# Patient Record
Sex: Female | Born: 1950 | Race: White | Hispanic: No | Marital: Married | Smoking: Never smoker
Health system: Southern US, Community
[De-identification: ages and names within clinical notes are randomized; demographics above are authoritative.]

## PROBLEM LIST (undated history)

## (undated) DIAGNOSIS — Z923 Personal history of irradiation: Secondary | ICD-10-CM

---

## 1994-01-12 DIAGNOSIS — C50919 Malignant neoplasm of unspecified site of unspecified female breast: Secondary | ICD-10-CM

## 1994-01-12 HISTORY — DX: Malignant neoplasm of unspecified site of unspecified female breast: C50.919

## 1994-01-12 HISTORY — PX: BREAST LUMPECTOMY: SHX2

## 1997-08-13 ENCOUNTER — Other Ambulatory Visit: Admission: RE | Admit: 1997-08-13 | Discharge: 1997-08-13 | Payer: Self-pay | Admitting: *Deleted

## 1997-09-21 ENCOUNTER — Ambulatory Visit (HOSPITAL_COMMUNITY): Admission: RE | Admit: 1997-09-21 | Discharge: 1997-09-21 | Payer: Self-pay | Admitting: Family Medicine

## 1998-09-30 ENCOUNTER — Other Ambulatory Visit: Admission: RE | Admit: 1998-09-30 | Discharge: 1998-09-30 | Payer: Self-pay | Admitting: *Deleted

## 1999-09-22 ENCOUNTER — Other Ambulatory Visit: Admission: RE | Admit: 1999-09-22 | Discharge: 1999-09-22 | Payer: Self-pay | Admitting: *Deleted

## 1999-10-20 ENCOUNTER — Encounter: Admission: RE | Admit: 1999-10-20 | Discharge: 1999-10-20 | Payer: Self-pay | Admitting: *Deleted

## 1999-10-20 ENCOUNTER — Encounter: Payer: Self-pay | Admitting: *Deleted

## 2000-10-11 ENCOUNTER — Other Ambulatory Visit: Admission: RE | Admit: 2000-10-11 | Discharge: 2000-10-11 | Payer: Self-pay | Admitting: *Deleted

## 2000-11-04 ENCOUNTER — Encounter: Admission: RE | Admit: 2000-11-04 | Discharge: 2000-11-04 | Payer: Self-pay | Admitting: *Deleted

## 2000-11-04 ENCOUNTER — Encounter: Payer: Self-pay | Admitting: *Deleted

## 2001-11-07 ENCOUNTER — Encounter: Payer: Self-pay | Admitting: *Deleted

## 2001-11-07 ENCOUNTER — Encounter: Admission: RE | Admit: 2001-11-07 | Discharge: 2001-11-07 | Payer: Self-pay | Admitting: *Deleted

## 2001-11-11 ENCOUNTER — Other Ambulatory Visit: Admission: RE | Admit: 2001-11-11 | Discharge: 2001-11-11 | Payer: Self-pay | Admitting: *Deleted

## 2002-04-14 ENCOUNTER — Other Ambulatory Visit: Admission: RE | Admit: 2002-04-14 | Discharge: 2002-04-14 | Payer: Self-pay | Admitting: *Deleted

## 2002-11-10 ENCOUNTER — Encounter: Admission: RE | Admit: 2002-11-10 | Discharge: 2002-11-10 | Payer: Self-pay | Admitting: *Deleted

## 2003-04-13 ENCOUNTER — Other Ambulatory Visit: Admission: RE | Admit: 2003-04-13 | Discharge: 2003-04-13 | Payer: Self-pay | Admitting: *Deleted

## 2004-01-30 ENCOUNTER — Encounter: Admission: RE | Admit: 2004-01-30 | Discharge: 2004-01-30 | Payer: Self-pay | Admitting: Obstetrics and Gynecology

## 2004-06-04 ENCOUNTER — Other Ambulatory Visit: Admission: RE | Admit: 2004-06-04 | Discharge: 2004-06-04 | Payer: Self-pay | Admitting: Obstetrics and Gynecology

## 2008-05-02 ENCOUNTER — Encounter: Admission: RE | Admit: 2008-05-02 | Discharge: 2008-05-02 | Payer: Self-pay | Admitting: Family Medicine

## 2008-05-31 ENCOUNTER — Other Ambulatory Visit: Admission: RE | Admit: 2008-05-31 | Discharge: 2008-05-31 | Payer: Self-pay | Admitting: Family Medicine

## 2009-05-07 ENCOUNTER — Encounter: Admission: RE | Admit: 2009-05-07 | Discharge: 2009-05-07 | Payer: Self-pay | Admitting: Family Medicine

## 2010-04-22 ENCOUNTER — Other Ambulatory Visit: Payer: Self-pay | Admitting: Obstetrics and Gynecology

## 2010-04-22 DIAGNOSIS — Z1231 Encounter for screening mammogram for malignant neoplasm of breast: Secondary | ICD-10-CM

## 2010-05-12 ENCOUNTER — Ambulatory Visit
Admission: RE | Admit: 2010-05-12 | Discharge: 2010-05-12 | Disposition: A | Discharge status: Home / Self Care | Payer: BC Managed Care – PPO | Source: Ambulatory Visit | Attending: Obstetrics and Gynecology | Admitting: Obstetrics and Gynecology

## 2010-05-12 DIAGNOSIS — Z1231 Encounter for screening mammogram for malignant neoplasm of breast: Secondary | ICD-10-CM

## 2010-05-14 ENCOUNTER — Other Ambulatory Visit: Payer: Self-pay | Admitting: Obstetrics and Gynecology

## 2010-05-14 DIAGNOSIS — R928 Other abnormal and inconclusive findings on diagnostic imaging of breast: Secondary | ICD-10-CM

## 2010-05-19 ENCOUNTER — Ambulatory Visit
Admission: RE | Admit: 2010-05-19 | Discharge: 2010-05-19 | Disposition: A | Discharge status: Home / Self Care | Payer: BC Managed Care – PPO | Source: Ambulatory Visit | Attending: Obstetrics and Gynecology | Admitting: Obstetrics and Gynecology

## 2010-05-19 DIAGNOSIS — R928 Other abnormal and inconclusive findings on diagnostic imaging of breast: Secondary | ICD-10-CM

## 2011-05-12 ENCOUNTER — Other Ambulatory Visit: Payer: Self-pay | Admitting: Obstetrics and Gynecology

## 2011-05-12 DIAGNOSIS — Z1231 Encounter for screening mammogram for malignant neoplasm of breast: Secondary | ICD-10-CM

## 2011-05-22 ENCOUNTER — Ambulatory Visit
Admission: RE | Admit: 2011-05-22 | Discharge: 2011-05-22 | Disposition: A | Discharge status: Home / Self Care | Payer: BC Managed Care – PPO | Source: Ambulatory Visit | Attending: Obstetrics and Gynecology | Admitting: Obstetrics and Gynecology

## 2011-05-22 DIAGNOSIS — Z1231 Encounter for screening mammogram for malignant neoplasm of breast: Secondary | ICD-10-CM

## 2012-07-01 ENCOUNTER — Other Ambulatory Visit: Payer: Self-pay

## 2012-07-01 DIAGNOSIS — Z1231 Encounter for screening mammogram for malignant neoplasm of breast: Secondary | ICD-10-CM

## 2012-07-05 ENCOUNTER — Ambulatory Visit
Admission: RE | Admit: 2012-07-05 | Discharge: 2012-07-05 | Disposition: A | Discharge status: Home / Self Care | Payer: BC Managed Care – PPO | Source: Ambulatory Visit

## 2012-07-05 DIAGNOSIS — Z1231 Encounter for screening mammogram for malignant neoplasm of breast: Secondary | ICD-10-CM

## 2013-08-03 ENCOUNTER — Other Ambulatory Visit: Payer: Self-pay

## 2013-08-03 DIAGNOSIS — Z1231 Encounter for screening mammogram for malignant neoplasm of breast: Secondary | ICD-10-CM

## 2013-08-07 ENCOUNTER — Ambulatory Visit
Admission: RE | Admit: 2013-08-07 | Discharge: 2013-08-07 | Disposition: A | Discharge status: Home / Self Care | Payer: BC Managed Care – PPO | Source: Ambulatory Visit

## 2013-08-07 DIAGNOSIS — Z1231 Encounter for screening mammogram for malignant neoplasm of breast: Secondary | ICD-10-CM

## 2014-07-02 ENCOUNTER — Other Ambulatory Visit: Payer: Self-pay

## 2014-07-02 DIAGNOSIS — Z1231 Encounter for screening mammogram for malignant neoplasm of breast: Secondary | ICD-10-CM

## 2014-07-02 DIAGNOSIS — Z853 Personal history of malignant neoplasm of breast: Secondary | ICD-10-CM

## 2014-07-02 DIAGNOSIS — Z9889 Other specified postprocedural states: Secondary | ICD-10-CM

## 2014-08-20 ENCOUNTER — Ambulatory Visit
Admission: RE | Admit: 2014-08-20 | Discharge: 2014-08-20 | Disposition: A | Discharge status: Home / Self Care | Payer: BC Managed Care – PPO | Source: Ambulatory Visit

## 2014-08-20 DIAGNOSIS — Z853 Personal history of malignant neoplasm of breast: Secondary | ICD-10-CM

## 2014-08-20 DIAGNOSIS — Z1231 Encounter for screening mammogram for malignant neoplasm of breast: Secondary | ICD-10-CM

## 2014-08-20 DIAGNOSIS — Z9889 Other specified postprocedural states: Secondary | ICD-10-CM

## 2015-07-30 ENCOUNTER — Other Ambulatory Visit: Payer: Self-pay | Admitting: Internal Medicine

## 2015-07-30 ENCOUNTER — Other Ambulatory Visit: Payer: Self-pay | Admitting: Obstetrics and Gynecology

## 2015-07-30 DIAGNOSIS — Z1231 Encounter for screening mammogram for malignant neoplasm of breast: Secondary | ICD-10-CM

## 2015-09-02 ENCOUNTER — Ambulatory Visit: Payer: BC Managed Care – PPO

## 2015-09-05 ENCOUNTER — Ambulatory Visit
Admission: RE | Admit: 2015-09-05 | Discharge: 2015-09-05 | Disposition: A | Discharge status: Home / Self Care | Payer: BC Managed Care – PPO | Source: Ambulatory Visit | Attending: Internal Medicine | Admitting: Internal Medicine

## 2015-09-05 DIAGNOSIS — Z1231 Encounter for screening mammogram for malignant neoplasm of breast: Secondary | ICD-10-CM

## 2016-07-29 ENCOUNTER — Other Ambulatory Visit: Payer: Self-pay | Admitting: Internal Medicine

## 2016-07-29 ENCOUNTER — Other Ambulatory Visit: Payer: Self-pay | Admitting: Family Medicine

## 2016-07-29 DIAGNOSIS — Z1231 Encounter for screening mammogram for malignant neoplasm of breast: Secondary | ICD-10-CM

## 2016-09-10 ENCOUNTER — Ambulatory Visit
Admission: RE | Admit: 2016-09-10 | Discharge: 2016-09-10 | Disposition: A | Discharge status: Home / Self Care | Payer: BC Managed Care – PPO | Source: Ambulatory Visit | Attending: Family Medicine | Admitting: Family Medicine

## 2016-09-10 DIAGNOSIS — Z1231 Encounter for screening mammogram for malignant neoplasm of breast: Secondary | ICD-10-CM

## 2017-08-11 ENCOUNTER — Other Ambulatory Visit: Payer: Self-pay | Admitting: Family Medicine

## 2017-08-11 DIAGNOSIS — Z1231 Encounter for screening mammogram for malignant neoplasm of breast: Secondary | ICD-10-CM

## 2017-09-29 ENCOUNTER — Ambulatory Visit: Payer: BC Managed Care – PPO

## 2017-10-06 ENCOUNTER — Ambulatory Visit
Admission: RE | Admit: 2017-10-06 | Discharge: 2017-10-06 | Disposition: A | Discharge status: Home / Self Care | Payer: Medicare Other | Source: Ambulatory Visit | Attending: Family Medicine | Admitting: Family Medicine

## 2017-10-06 DIAGNOSIS — Z1231 Encounter for screening mammogram for malignant neoplasm of breast: Secondary | ICD-10-CM

## 2017-10-06 HISTORY — DX: Personal history of irradiation: Z92.3

## 2017-10-07 ENCOUNTER — Other Ambulatory Visit: Payer: Self-pay | Admitting: Family Medicine

## 2017-10-07 DIAGNOSIS — R928 Other abnormal and inconclusive findings on diagnostic imaging of breast: Secondary | ICD-10-CM

## 2017-10-12 ENCOUNTER — Other Ambulatory Visit: Payer: Self-pay | Admitting: Internal Medicine

## 2017-10-12 DIAGNOSIS — R928 Other abnormal and inconclusive findings on diagnostic imaging of breast: Secondary | ICD-10-CM

## 2017-10-13 ENCOUNTER — Ambulatory Visit
Admission: RE | Admit: 2017-10-13 | Discharge: 2017-10-13 | Disposition: A | Discharge status: Home / Self Care | Payer: Medicare Other | Source: Ambulatory Visit | Attending: Family Medicine | Admitting: Family Medicine

## 2017-10-13 DIAGNOSIS — R928 Other abnormal and inconclusive findings on diagnostic imaging of breast: Secondary | ICD-10-CM

## 2018-09-07 ENCOUNTER — Other Ambulatory Visit: Payer: Self-pay | Admitting: Family Medicine

## 2018-09-07 DIAGNOSIS — Z1231 Encounter for screening mammogram for malignant neoplasm of breast: Secondary | ICD-10-CM

## 2018-12-20 ENCOUNTER — Ambulatory Visit
Admission: RE | Admit: 2018-12-20 | Discharge: 2018-12-20 | Disposition: A | Discharge status: Home / Self Care | Payer: Medicare Other | Source: Ambulatory Visit | Attending: Family Medicine | Admitting: Family Medicine

## 2018-12-20 ENCOUNTER — Other Ambulatory Visit: Payer: Self-pay

## 2018-12-20 DIAGNOSIS — Z1231 Encounter for screening mammogram for malignant neoplasm of breast: Secondary | ICD-10-CM

## 2018-12-22 ENCOUNTER — Other Ambulatory Visit: Payer: Self-pay

## 2018-12-22 ENCOUNTER — Encounter (INDEPENDENT_AMBULATORY_CARE_PROVIDER_SITE_OTHER): Payer: Self-pay | Admitting: Otolaryngology

## 2018-12-22 ENCOUNTER — Ambulatory Visit (INDEPENDENT_AMBULATORY_CARE_PROVIDER_SITE_OTHER): Payer: Medicare Other | Admitting: Otolaryngology

## 2018-12-22 VITALS — Temp 98.1°F

## 2018-12-22 DIAGNOSIS — H6123 Impacted cerumen, bilateral: Secondary | ICD-10-CM

## 2018-12-22 NOTE — Progress Notes (Signed)
HPI: Krista Horn is a 68 y.o. female who presents for evaluation of blockage of her hearing especially on the left side.  Is doing little bit better today..  Past Medical History:  Diagnosis Date  . Personal history of radiation therapy    Past Surgical History:  Procedure Laterality Date  . BREAST LUMPECTOMY Left 1996   Social History   Socioeconomic History  . Marital status: Married    Spouse name: Not on file  . Number of children: Not on file  . Years of education: Not on file  . Highest education level: Not on file  Occupational History  . Not on file  Tobacco Use  . Smoking status: Never Smoker  . Smokeless tobacco: Never Used  Substance and Sexual Activity  . Alcohol use: Not on file  . Drug use: Not on file  . Sexual activity: Not on file  Other Topics Concern  . Not on file  Social History Narrative  . Not on file   Social Determinants of Health   Financial Resource Strain:   . Difficulty of Paying Living Expenses: Not on file  Food Insecurity:   . Worried About Charity fundraiser in the Last Year: Not on file  . Ran Out of Food in the Last Year: Not on file  Transportation Needs:   . Lack of Transportation (Medical): Not on file  . Lack of Transportation (Non-Medical): Not on file  Physical Activity:   . Days of Exercise per Week: Not on file  . Minutes of Exercise per Session: Not on file  Stress:   . Feeling of Stress : Not on file  Social Connections:   . Frequency of Communication with Friends and Family: Not on file  . Frequency of Social Gatherings with Friends and Family: Not on file  . Attends Religious Services: Not on file  . Active Member of Clubs or Organizations: Not on file  . Attends Archivist Meetings: Not on file  . Marital Status: Not on file   No family history on file. No Known Allergies Prior to Admission medications   Not on File     Positive ROS: Otherwise negative  All other systems have been  reviewed and were otherwise negative with the exception of those mentioned in the HPI and as above.  Physical Exam: Constitutional: Alert, well-appearing, no acute distress Ears: External ears without lesions or tenderness. Ear canals moderate cerumen buildup. Nasal: External nose without lesions. Clear nasal passages Oral: Oropharynx clear. Neck: No palpable adenopathy or masses Respiratory: Breathing comfortably  Skin: No facial/neck lesions or rash noted.  Cerumen impaction removal  Date/Time: 12/22/2018 5:33 PM Performed by: Rozetta Nunnery, MD Authorized by: Rozetta Nunnery, MD   Consent:    Consent obtained:  Verbal   Consent given by:  Patient   Risks discussed:  Pain and bleeding Procedure details:    Location:  L ear and R ear   Procedure type: suction and forceps   Post-procedure details:    Inspection:  TM intact and canal normal   Hearing quality:  Improved   Patient tolerance of procedure:  Tolerated well, no immediate complications    Assessment: Cerumen buildup  Plan: This was cleaned in the office. She will follow-up as needed  Radene Journey, MD

## 2019-06-30 ENCOUNTER — Other Ambulatory Visit: Payer: Self-pay

## 2019-06-30 ENCOUNTER — Encounter (INDEPENDENT_AMBULATORY_CARE_PROVIDER_SITE_OTHER): Payer: Self-pay | Admitting: Otolaryngology

## 2019-06-30 ENCOUNTER — Ambulatory Visit (INDEPENDENT_AMBULATORY_CARE_PROVIDER_SITE_OTHER): Payer: Medicare PPO | Admitting: Otolaryngology

## 2019-06-30 VITALS — Temp 97.7°F

## 2019-06-30 DIAGNOSIS — H6123 Impacted cerumen, bilateral: Secondary | ICD-10-CM

## 2019-06-30 DIAGNOSIS — H60312 Diffuse otitis externa, left ear: Secondary | ICD-10-CM | POA: Diagnosis not present

## 2019-06-30 NOTE — Progress Notes (Signed)
HPI: Krista Horn is a 69 y.o. female who returns today for evaluation of pain and drainage from the left ear over the past couple weeks.  She had previously been seen in December of last year with cerumen impaction and left external otitis..  Past Medical History:  Diagnosis Date  . Personal history of radiation therapy    Past Surgical History:  Procedure Laterality Date  . BREAST LUMPECTOMY Left 1996   Social History   Socioeconomic History  . Marital status: Married    Spouse name: Not on file  . Number of children: Not on file  . Years of education: Not on file  . Highest education level: Not on file  Occupational History  . Not on file  Tobacco Use  . Smoking status: Never Smoker  . Smokeless tobacco: Never Used  Substance and Sexual Activity  . Alcohol use: Not on file  . Drug use: Not on file  . Sexual activity: Not on file  Other Topics Concern  . Not on file  Social History Narrative  . Not on file   Social Determinants of Health   Financial Resource Strain:   . Difficulty of Paying Living Expenses:   Food Insecurity:   . Worried About Charity fundraiser in the Last Year:   . Arboriculturist in the Last Year:   Transportation Needs:   . Film/video editor (Medical):   Marland Kitchen Lack of Transportation (Non-Medical):   Physical Activity:   . Days of Exercise per Week:   . Minutes of Exercise per Session:   Stress:   . Feeling of Stress :   Social Connections:   . Frequency of Communication with Friends and Family:   . Frequency of Social Gatherings with Friends and Family:   . Attends Religious Services:   . Active Member of Clubs or Organizations:   . Attends Archivist Meetings:   Marland Kitchen Marital Status:    No family history on file. No Known Allergies Prior to Admission medications   Not on File     Positive ROS: Otherwise negative  All other systems have been reviewed and were otherwise negative with the exception of those  mentioned in the HPI and as above.  Physical Exam: Constitutional: Alert, well-appearing, no acute distress Ears: External ears without lesions or tenderness.  Right ear canal had minimal cerumen buildup that was cleaned with a curette.  Right ear canal and TM are otherwise clear.  Left ear canal reveals a large amount of debris as well as fungal elements with left fungal external otitis with some granulation tissue posteriorly superiorly.  After cleaning the ear canal with suction and hydrogen peroxide.  I applied gentian violet Ciprodex and CSF HC powder to the left ear only. Nasal: External nose without lesions.. Clear nasal passages Oral: Lips and gums without lesions. Tongue and palate mucosa without lesions. Posterior oropharynx clear. Neck: No palpable adenopathy or masses Respiratory: Breathing comfortably  Skin: No facial/neck lesions or rash noted.  Cerumen impaction removal  Date/Time: 06/30/2019 5:03 PM Performed by: Rozetta Nunnery, MD Authorized by: Rozetta Nunnery, MD   Consent:    Consent obtained:  Verbal   Consent given by:  Patient   Risks discussed:  Pain and bleeding Procedure details:    Location:  L ear and R ear   Procedure type: curette and suction   Post-procedure details:    Inspection:  TM intact and canal normal   Hearing  quality:  Improved   Patient tolerance of procedure:  Tolerated well, no immediate complications Comments:     Right ear canal had minimal wax buildup that was removed with a curette.  Left ear canal was occluded with debris as well as fungal external otitis.  After cleaning the ear canal I applied gentian violet, Ciprodex and CSF powder to the left ear.    Assessment: Cerumen buildup bilaterally. Acute left external otitis with fungal elements.  Plan: Recommended keeping the left ear dry for the next 2 days. I prescribed Cortisporin otic suspension drops to use if she develops any increasing pain or discomfort. Also  reviewed with her concerning irrigating or washing the ears with alcohol and vinegar in the future if she has any itching or discomfort in the ears. She will follow-up as needed.   Radene Journey, MD

## 2019-10-06 ENCOUNTER — Other Ambulatory Visit (INDEPENDENT_AMBULATORY_CARE_PROVIDER_SITE_OTHER): Payer: Self-pay

## 2019-10-06 MED ORDER — NEOMYCIN-POLYMYXIN-HC 3.5-10000-1 OT SOLN: 4.0000 [drp] | Freq: Four times a day (QID) | OTIC | 0 refills | Status: AC

## 2019-10-09 ENCOUNTER — Other Ambulatory Visit: Payer: Self-pay

## 2019-10-09 ENCOUNTER — Ambulatory Visit (INDEPENDENT_AMBULATORY_CARE_PROVIDER_SITE_OTHER): Payer: Medicare PPO | Admitting: Otolaryngology

## 2019-10-09 VITALS — Temp 97.3°F

## 2019-10-09 DIAGNOSIS — H60312 Diffuse otitis externa, left ear: Secondary | ICD-10-CM | POA: Diagnosis not present

## 2019-10-09 DIAGNOSIS — H6123 Impacted cerumen, bilateral: Secondary | ICD-10-CM | POA: Diagnosis not present

## 2019-10-09 NOTE — Progress Notes (Signed)
HPI: Krista Horn is a 69 y.o. female who presents for evaluation of left ear complaints.  She was seen 3 months ago with chronic left external otitis.  She has done well up until just recently with blockage of her left ear.  She was prescribed antibiotic eardrops which she did not get on her previous visit and started these this past weekend but still has blockage of the left ear.  She has underlying hearing loss in both ears..  Past Medical History:  Diagnosis Date  . Personal history of radiation therapy    Past Surgical History:  Procedure Laterality Date  . BREAST LUMPECTOMY Left 1996   Social History   Socioeconomic History  . Marital status: Married    Spouse name: Not on file  . Number of children: Not on file  . Years of education: Not on file  . Highest education level: Not on file  Occupational History  . Not on file  Tobacco Use  . Smoking status: Never Smoker  . Smokeless tobacco: Never Used  Substance and Sexual Activity  . Alcohol use: Not on file  . Drug use: Not on file  . Sexual activity: Not on file  Other Topics Concern  . Not on file  Social History Narrative  . Not on file   Social Determinants of Health   Financial Resource Strain:   . Difficulty of Paying Living Expenses: Not on file  Food Insecurity:   . Worried About Charity fundraiser in the Last Year: Not on file  . Ran Out of Food in the Last Year: Not on file  Transportation Needs:   . Lack of Transportation (Medical): Not on file  . Lack of Transportation (Non-Medical): Not on file  Physical Activity:   . Days of Exercise per Week: Not on file  . Minutes of Exercise per Session: Not on file  Stress:   . Feeling of Stress : Not on file  Social Connections:   . Frequency of Communication with Friends and Family: Not on file  . Frequency of Social Gatherings with Friends and Family: Not on file  . Attends Religious Services: Not on file  . Active Member of Clubs or  Organizations: Not on file  . Attends Archivist Meetings: Not on file  . Marital Status: Not on file   No family history on file. No Known Allergies Prior to Admission medications   Medication Sig Start Date End Date Taking? Authorizing Provider  neomycin-polymyxin-hydrocortisone (CORTISPORIN) OTIC solution Place 4 drops into the left ear 4 (four) times daily. Use 4-5-drops in ear for pain for 1 week. 10/06/19   Rozetta Nunnery, MD     Positive ROS: Otherwise negative  All other systems have been reviewed and were otherwise negative with the exception of those mentioned in the HPI and as above.  Physical Exam: Constitutional: Alert, well-appearing, no acute distress Ears: External ears without lesions or tenderness. Ear canals with minimal wax buildup on the right side and normal right TM.  Left ear canal reveals chronic external otitis with very thickened left TM and small amount of granulation tissue in the left ear canal.  This was cleaned with suction and irrigation with hydroperoxide and alcohol.  After cleaning the left.  ear canal I applied gentian violet, Ciprodex and CSF powder..  On tuning fork testing after cleaning the ears AC > BC bilaterally however Weber lateralized to the left side.  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 Nasal: External nose without lesions. Clear nasal passages Oral: Oropharynx clear. Neck: No palpable adenopathy or masses Respiratory: Breathing comfortably  Skin: No facial/neck lesions or rash noted.  Cerumen impaction removal  Date/Time: 10/09/2019 11:24 AM Performed by: Rozetta Nunnery, MD Authorized by: Rozetta Nunnery, MD   Consent:    Consent obtained:  Verbal   Consent given by:  Patient   Risks discussed:  Pain and bleeding Procedure details:    Location:  L ear and R ear Post-procedure details:    Inspection:  TM intact and canal normal   Hearing  quality:  Improved   Patient tolerance of procedure:  Tolerated well, no immediate complications Comments:     Right ear canal with minimal wax that was cleaned with curette and suction.  Left ear canal was occluded with drainage as well as inflammatory changes in the small amount of granulation tissue.  After cleaning the left ear canal I applied gentian violet, Ciprodex and CSF powder to the left ear.    Assessment: Minimal wax on the right side with clear right TM. Left ear canal with chronic left external otitis with granulation tissue and very thickened left TM. 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 Recommended keeping the ear dry and not using the eardrops for the next 2 days.  She will follow up.  10 days for recheck.  Radene Journey, MD

## 2019-10-19 ENCOUNTER — Ambulatory Visit (INDEPENDENT_AMBULATORY_CARE_PROVIDER_SITE_OTHER): Payer: Medicare PPO | Admitting: Otolaryngology

## 2019-10-19 ENCOUNTER — Encounter (INDEPENDENT_AMBULATORY_CARE_PROVIDER_SITE_OTHER): Payer: Self-pay | Admitting: Otolaryngology

## 2019-10-19 ENCOUNTER — Other Ambulatory Visit: Payer: Self-pay

## 2019-10-19 VITALS — Temp 97.3°F

## 2019-10-19 DIAGNOSIS — H903 Sensorineural hearing loss, bilateral: Secondary | ICD-10-CM | POA: Diagnosis not present

## 2019-10-19 NOTE — Progress Notes (Signed)
HPI: Krista Horn is a 69 y.o. female who returns today for evaluation of hearing.  She brings with her a hearing test that demonstrates isolated high-frequency sensorineural hearing loss above 2000 frequency.  Both ears were relatively symmetric with hearing dropping down to 40-50 dB above the 3000 frequency.  In the 500 - 2000 range.  Her hearing was approximately 5-10 dB. Her left ear is doing much better  Past Medical History:  Diagnosis Date  . Personal history of radiation therapy    Past Surgical History:  Procedure Laterality Date  . BREAST LUMPECTOMY Left 1996   Social History   Socioeconomic History  . Marital status: Married    Spouse name: Not on file  . Number of children: Not on file  . Years of education: Not on file  . Highest education level: Not on file  Occupational History  . Not on file  Tobacco Use  . Smoking status: Never Smoker  . Smokeless tobacco: Never Used  Substance and Sexual Activity  . Alcohol use: Not on file  . Drug use: Not on file  . Sexual activity: Not on file  Other Topics Concern  . Not on file  Social History Narrative  . Not on file   Social Determinants of Health   Financial Resource Strain:   . Difficulty of Paying Living Expenses: Not on file  Food Insecurity:   . Worried About Charity fundraiser in the Last Year: Not on file  . Ran Out of Food in the Last Year: Not on file  Transportation Needs:   . Lack of Transportation (Medical): Not on file  . Lack of Transportation (Non-Medical): Not on file  Physical Activity:   . Days of Exercise per Week: Not on file  . Minutes of Exercise per Session: Not on file  Stress:   . Feeling of Stress : Not on file  Social Connections:   . Frequency of Communication with Friends and Family: Not on file  . Frequency of Social Gatherings with Friends and Family: Not on file  . Attends Religious Services: Not on file  . Active Member of Clubs or Organizations: Not on file  .  Attends Archivist Meetings: Not on file  . Marital Status: Not on file   No family history on file. No Known Allergies Prior to Admission medications   Medication Sig Start Date End Date Taking? Authorizing Provider  neomycin-polymyxin-hydrocortisone (CORTISPORIN) OTIC solution Place 4 drops into the left ear 4 (four) times daily. Use 4-5-drops in ear for pain for 1 week. 10/06/19   Rozetta Nunnery, MD     Positive ROS: Otherwise negative  All other systems have been reviewed and were otherwise negative with the exception of those mentioned in the HPI and as above.  Physical Exam: Constitutional: Alert, well-appearing, no acute distress Ears: External ears without lesions or tenderness.  Ear canals are clear bilaterally with gentian violet in the left ear canal.  Ear canals are dry TMs are clear with good mobility pneumatic otoscopy. Nasal: External nose without lesions.. Clear nasal passages Oral: Lips and gums without lesions. Tongue and palate mucosa without lesions. Posterior oropharynx clear. Neck: No palpable adenopathy or masses Respiratory: Breathing comfortably  Skin: No facial/neck lesions or rash noted.  Procedures  Assessment: History of recurrent left external otitis. High-frequency sensorineural hearing loss in both ears above 3000 frequency.  Plan: She inquires about whether to get hearing aids or not.  I discussed with her  that her hearing in the mid and lower frequencies is good but she does have a high-frequency sensorineural hearing loss and notices problems with her hearing when there is background noise as would be expected.  Discussed that hearing aids would be beneficial if she is having trouble socially with her hearing. Concerning the external otitis recommend keeping the ear dry in the ear should do well.  She will follow-up if she has any recurrent ear problems.   Radene Journey, MD

## 2019-10-23 ENCOUNTER — Encounter (INDEPENDENT_AMBULATORY_CARE_PROVIDER_SITE_OTHER): Payer: Self-pay

## 2019-11-09 ENCOUNTER — Other Ambulatory Visit: Payer: Self-pay | Admitting: Internal Medicine

## 2019-11-09 DIAGNOSIS — Z1231 Encounter for screening mammogram for malignant neoplasm of breast: Secondary | ICD-10-CM

## 2019-12-21 ENCOUNTER — Ambulatory Visit: Payer: Medicare PPO

## 2020-01-30 ENCOUNTER — Ambulatory Visit: Payer: Medicare PPO

## 2020-03-12 ENCOUNTER — Ambulatory Visit
Admission: RE | Admit: 2020-03-12 | Discharge: 2020-03-12 | Disposition: A | Discharge status: Home / Self Care | Payer: Medicare PPO | Source: Ambulatory Visit | Attending: Internal Medicine | Admitting: Internal Medicine

## 2020-03-12 ENCOUNTER — Ambulatory Visit: Payer: Medicare PPO

## 2020-03-12 ENCOUNTER — Other Ambulatory Visit: Payer: Self-pay

## 2020-03-12 DIAGNOSIS — Z1231 Encounter for screening mammogram for malignant neoplasm of breast: Secondary | ICD-10-CM

## 2020-11-21 DIAGNOSIS — L570 Actinic keratosis: Secondary | ICD-10-CM | POA: Diagnosis not present

## 2020-11-21 DIAGNOSIS — L814 Other melanin hyperpigmentation: Secondary | ICD-10-CM | POA: Diagnosis not present

## 2020-11-21 DIAGNOSIS — L300 Nummular dermatitis: Secondary | ICD-10-CM | POA: Diagnosis not present

## 2020-11-21 DIAGNOSIS — L821 Other seborrheic keratosis: Secondary | ICD-10-CM | POA: Diagnosis not present

## 2021-01-29 ENCOUNTER — Other Ambulatory Visit: Payer: Self-pay | Admitting: Internal Medicine

## 2021-01-29 DIAGNOSIS — Z1231 Encounter for screening mammogram for malignant neoplasm of breast: Secondary | ICD-10-CM

## 2021-03-13 ENCOUNTER — Ambulatory Visit: Payer: Medicare PPO

## 2021-03-13 ENCOUNTER — Ambulatory Visit
Admission: RE | Admit: 2021-03-13 | Discharge: 2021-03-13 | Disposition: A | Discharge status: Home / Self Care | Payer: Medicare PPO | Source: Ambulatory Visit | Attending: Internal Medicine | Admitting: Internal Medicine

## 2021-03-13 DIAGNOSIS — Z1231 Encounter for screening mammogram for malignant neoplasm of breast: Secondary | ICD-10-CM | POA: Diagnosis not present

## 2021-04-25 DIAGNOSIS — T148XXA Other injury of unspecified body region, initial encounter: Secondary | ICD-10-CM | POA: Diagnosis not present

## 2021-04-25 DIAGNOSIS — M8589 Other specified disorders of bone density and structure, multiple sites: Secondary | ICD-10-CM | POA: Diagnosis not present

## 2021-04-25 DIAGNOSIS — Z0000 Encounter for general adult medical examination without abnormal findings: Secondary | ICD-10-CM | POA: Diagnosis not present

## 2021-04-25 DIAGNOSIS — E559 Vitamin D deficiency, unspecified: Secondary | ICD-10-CM | POA: Diagnosis not present

## 2021-04-25 DIAGNOSIS — R5383 Other fatigue: Secondary | ICD-10-CM | POA: Diagnosis not present

## 2021-05-02 DIAGNOSIS — M8580 Other specified disorders of bone density and structure, unspecified site: Secondary | ICD-10-CM | POA: Diagnosis not present

## 2021-05-02 DIAGNOSIS — Z0000 Encounter for general adult medical examination without abnormal findings: Secondary | ICD-10-CM | POA: Diagnosis not present

## 2021-05-02 DIAGNOSIS — E559 Vitamin D deficiency, unspecified: Secondary | ICD-10-CM | POA: Diagnosis not present

## 2021-06-13 IMAGING — MG MM DIGITAL SCREENING BILAT W/ TOMO AND CAD
8 series · 9 of 24 positions shown · non-contrast
Comparison: Previous exam(s).

CLINICAL DATA: Screening.

EXAM:
DIGITAL SCREENING BILATERAL MAMMOGRAM WITH TOMOSYNTHESIS AND CAD
TECHNIQUE: Bilateral screening digital craniocaudal and mediolateral oblique
mammograms were obtained. Bilateral screening digital breast
tomosynthesis was performed. The images were evaluated with
computer-aided detection.

[L MLO synth-2D]
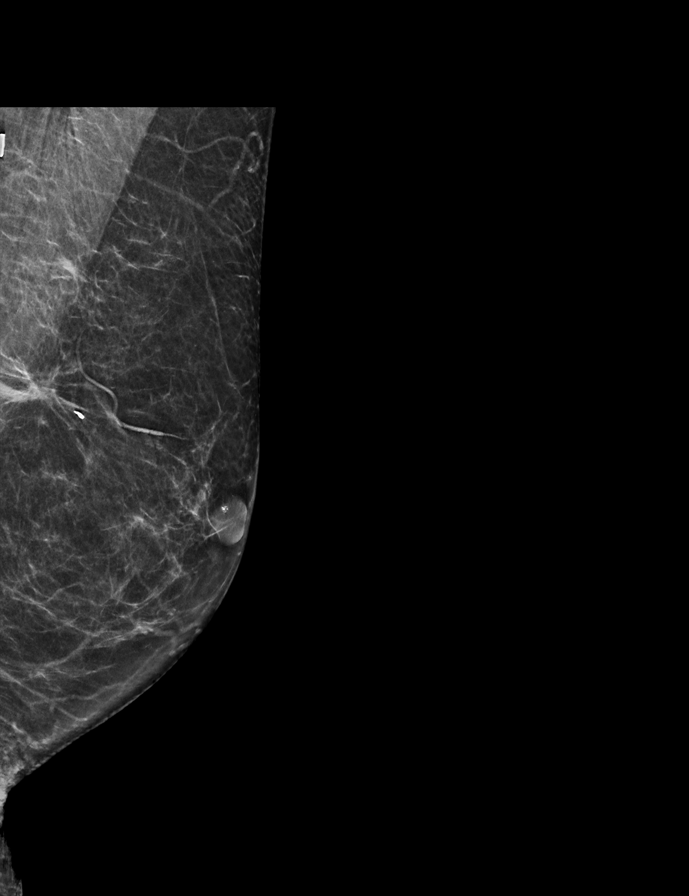

[L CC synth-2D]
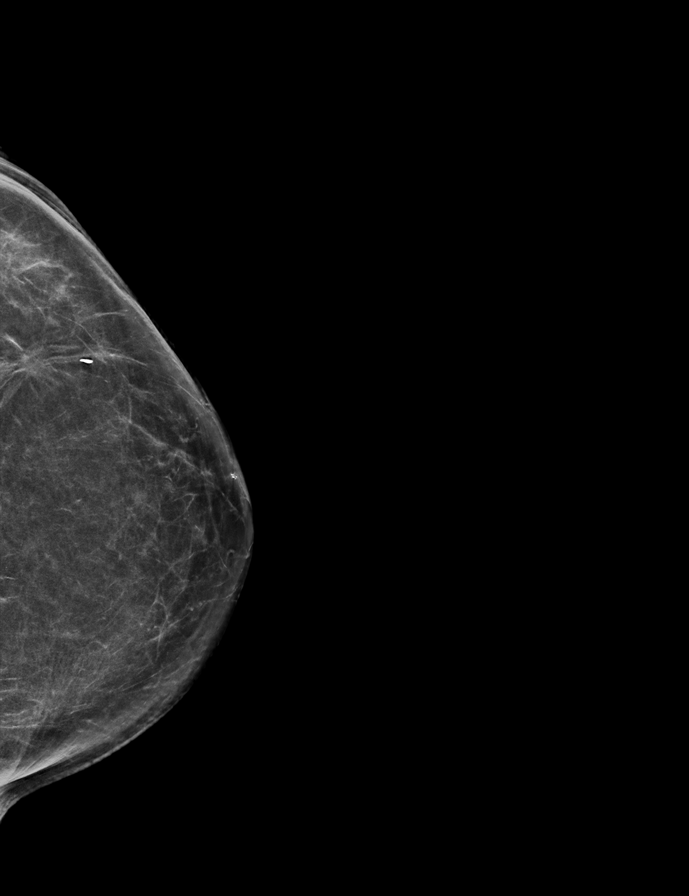

[R CC synth-2D]
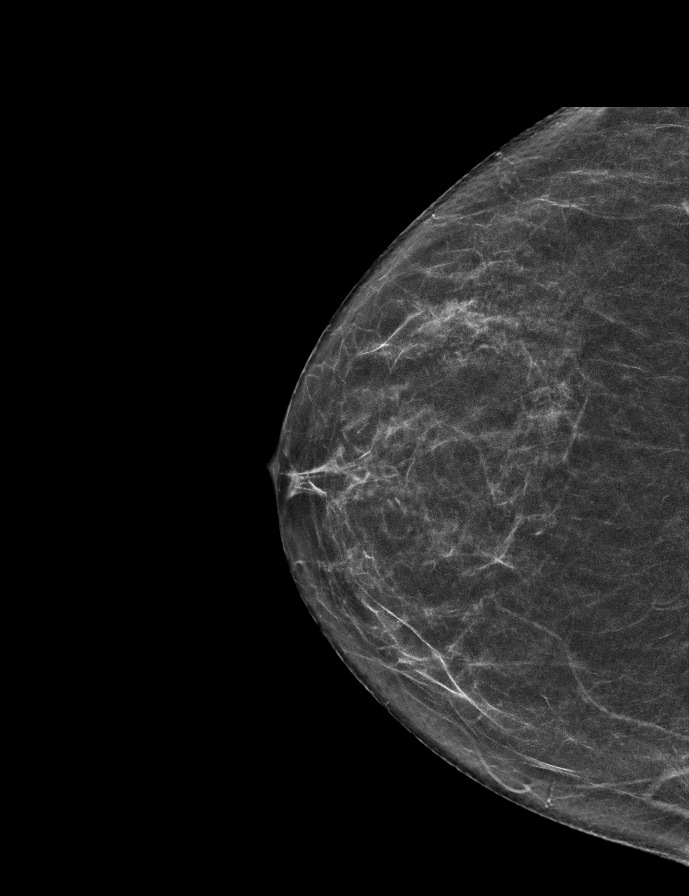

[R MLO synth-2D]
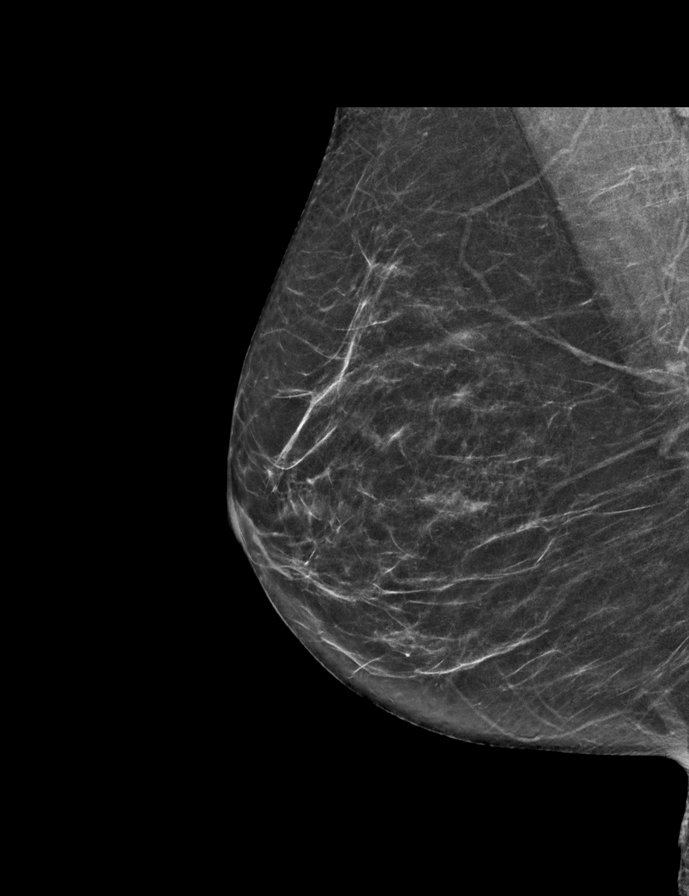

[L MLO tomo · 2 of 55 frames shown]
[frame 18/55]
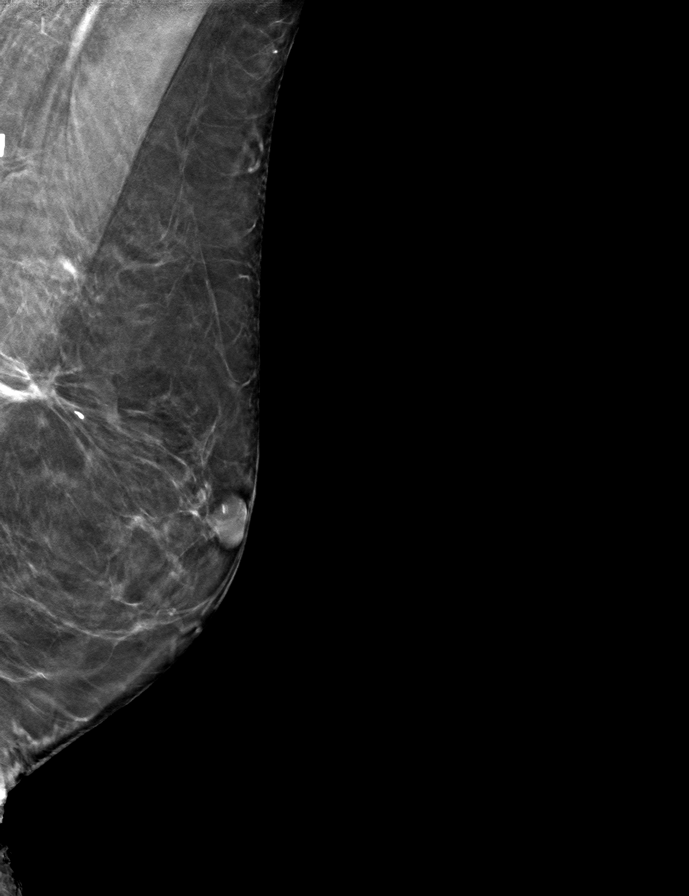
[frame 28/55]
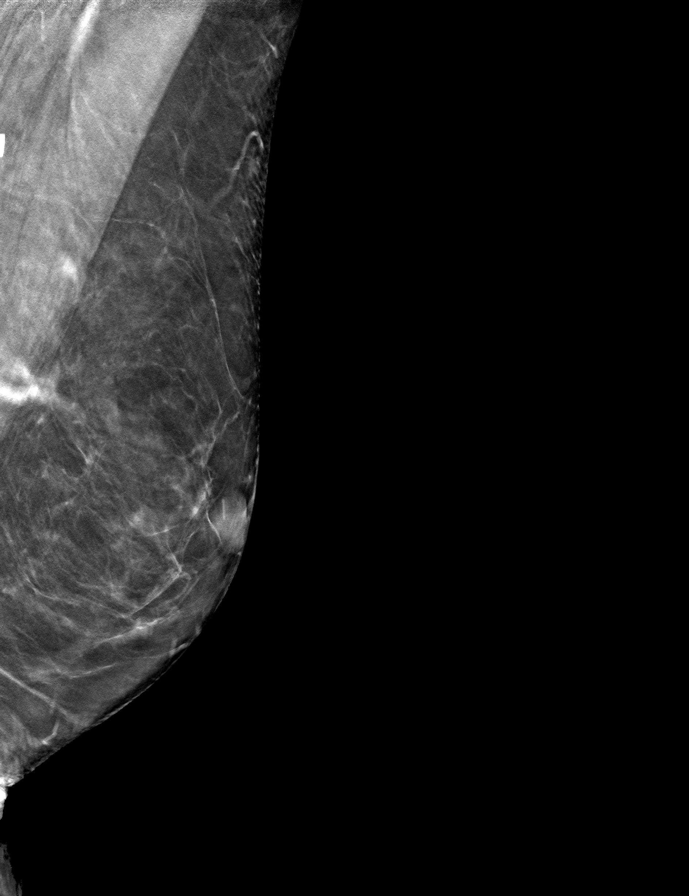

[R CC tomo · tomo slice 29/56.0]
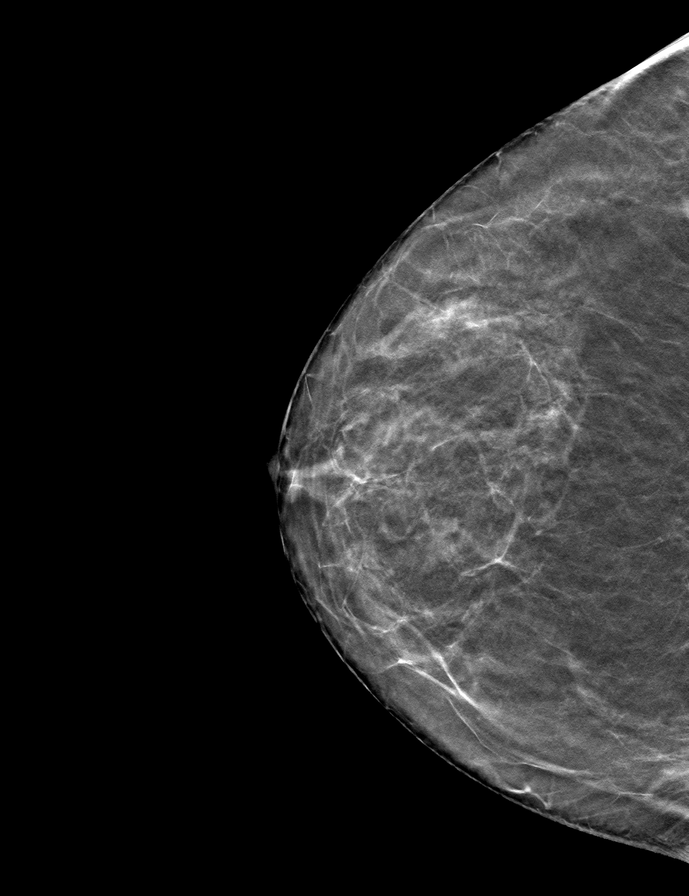

[R MLO tomo · tomo slice 29/56.0]
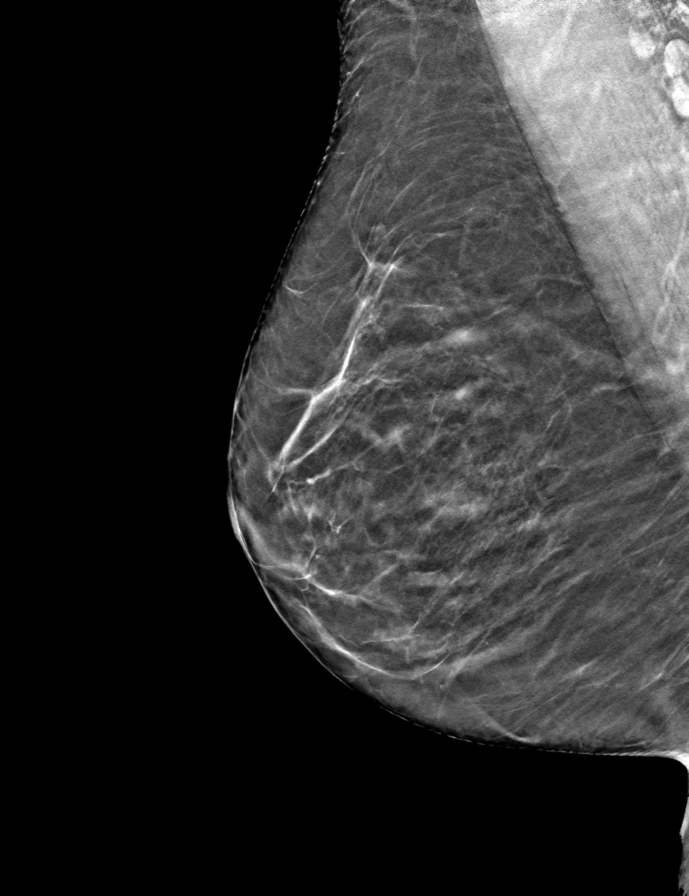

[L CC tomo · tomo slice 35/68.0]
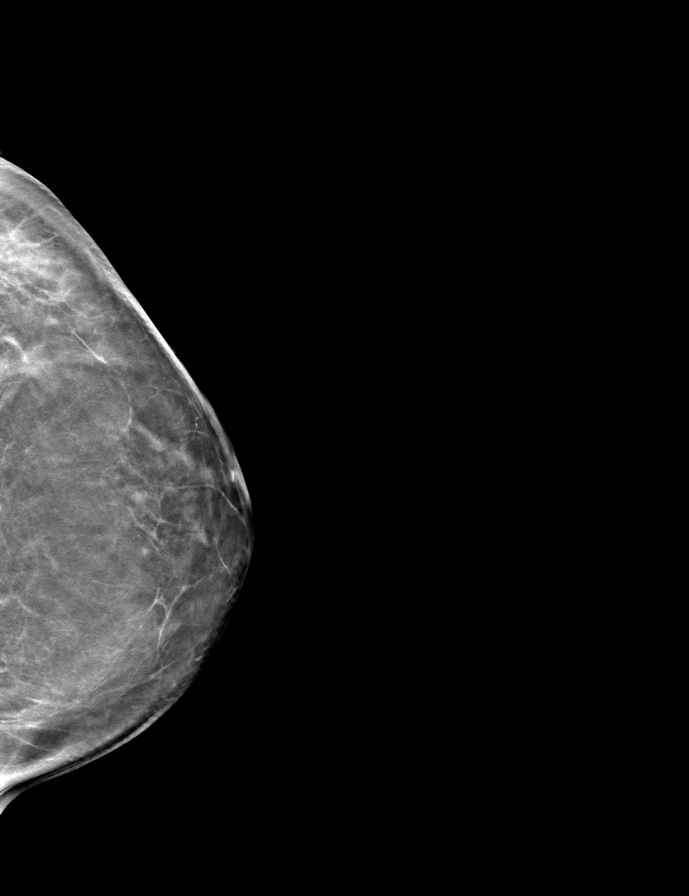

[9 of 24 positions shown; findings below may reference images not displayed]

ACR Breast Density Category b: There are scattered areas of
fibroglandular density.
FINDINGS: There are no findings suspicious for malignancy. The images were
evaluated with computer-aided detection.
IMPRESSION: No mammographic evidence of malignancy. A result letter of this
screening mammogram will be mailed directly to the patient.

RECOMMENDATION:
Screening mammogram in one year. (Code:WJ-[id number])

BI-RADS CATEGORY  1: Negative.

## 2021-07-03 DIAGNOSIS — H903 Sensorineural hearing loss, bilateral: Secondary | ICD-10-CM | POA: Diagnosis not present

## 2022-01-13 ENCOUNTER — Telehealth: Payer: Self-pay

## 2022-02-05 ENCOUNTER — Other Ambulatory Visit: Payer: Self-pay | Admitting: Internal Medicine

## 2022-02-05 DIAGNOSIS — Z1231 Encounter for screening mammogram for malignant neoplasm of breast: Secondary | ICD-10-CM

## 2022-03-02 DIAGNOSIS — L821 Other seborrheic keratosis: Secondary | ICD-10-CM | POA: Diagnosis not present

## 2022-03-02 DIAGNOSIS — D485 Neoplasm of uncertain behavior of skin: Secondary | ICD-10-CM | POA: Diagnosis not present

## 2022-03-02 DIAGNOSIS — I781 Nevus, non-neoplastic: Secondary | ICD-10-CM | POA: Diagnosis not present

## 2022-03-27 ENCOUNTER — Ambulatory Visit
Admission: RE | Admit: 2022-03-27 | Discharge: 2022-03-27 | Disposition: A | Discharge status: Home / Self Care | Payer: Medicare PPO | Source: Ambulatory Visit | Attending: Internal Medicine | Admitting: Internal Medicine

## 2022-03-27 DIAGNOSIS — Z1231 Encounter for screening mammogram for malignant neoplasm of breast: Secondary | ICD-10-CM | POA: Diagnosis not present

## 2022-05-18 DIAGNOSIS — R5383 Other fatigue: Secondary | ICD-10-CM | POA: Diagnosis not present

## 2022-05-18 DIAGNOSIS — E559 Vitamin D deficiency, unspecified: Secondary | ICD-10-CM | POA: Diagnosis not present

## 2022-05-18 DIAGNOSIS — Z0000 Encounter for general adult medical examination without abnormal findings: Secondary | ICD-10-CM | POA: Diagnosis not present

## 2022-05-22 DIAGNOSIS — Z0000 Encounter for general adult medical examination without abnormal findings: Secondary | ICD-10-CM | POA: Diagnosis not present

## 2022-05-22 DIAGNOSIS — E782 Mixed hyperlipidemia: Secondary | ICD-10-CM | POA: Diagnosis not present

## 2022-05-22 DIAGNOSIS — M8580 Other specified disorders of bone density and structure, unspecified site: Secondary | ICD-10-CM | POA: Diagnosis not present

## 2022-05-22 DIAGNOSIS — E559 Vitamin D deficiency, unspecified: Secondary | ICD-10-CM | POA: Diagnosis not present

## 2022-05-26 ENCOUNTER — Other Ambulatory Visit (HOSPITAL_COMMUNITY): Payer: Self-pay | Admitting: Internal Medicine

## 2022-05-26 DIAGNOSIS — Z0000 Encounter for general adult medical examination without abnormal findings: Secondary | ICD-10-CM

## 2022-06-12 ENCOUNTER — Ambulatory Visit (HOSPITAL_COMMUNITY)
Admission: RE | Admit: 2022-06-12 | Discharge: 2022-06-12 | Disposition: A | Discharge status: Home / Self Care | Payer: Self-pay | Source: Ambulatory Visit | Attending: Internal Medicine | Admitting: Internal Medicine

## 2022-06-12 DIAGNOSIS — Z0000 Encounter for general adult medical examination without abnormal findings: Secondary | ICD-10-CM | POA: Insufficient documentation

## 2022-10-01 DIAGNOSIS — H52223 Regular astigmatism, bilateral: Secondary | ICD-10-CM | POA: Diagnosis not present

## 2022-10-01 DIAGNOSIS — Z135 Encounter for screening for eye and ear disorders: Secondary | ICD-10-CM | POA: Diagnosis not present

## 2022-10-01 DIAGNOSIS — H259 Unspecified age-related cataract: Secondary | ICD-10-CM | POA: Diagnosis not present

## 2022-10-01 DIAGNOSIS — H524 Presbyopia: Secondary | ICD-10-CM | POA: Diagnosis not present

## 2022-10-01 DIAGNOSIS — D3132 Benign neoplasm of left choroid: Secondary | ICD-10-CM | POA: Diagnosis not present

## 2022-10-01 DIAGNOSIS — H5213 Myopia, bilateral: Secondary | ICD-10-CM | POA: Diagnosis not present

## 2022-12-23 DIAGNOSIS — H25043 Posterior subcapsular polar age-related cataract, bilateral: Secondary | ICD-10-CM | POA: Diagnosis not present

## 2022-12-23 DIAGNOSIS — H02834 Dermatochalasis of left upper eyelid: Secondary | ICD-10-CM | POA: Diagnosis not present

## 2022-12-23 DIAGNOSIS — H02831 Dermatochalasis of right upper eyelid: Secondary | ICD-10-CM | POA: Diagnosis not present

## 2022-12-23 DIAGNOSIS — H2513 Age-related nuclear cataract, bilateral: Secondary | ICD-10-CM | POA: Diagnosis not present

## 2022-12-23 DIAGNOSIS — H2512 Age-related nuclear cataract, left eye: Secondary | ICD-10-CM | POA: Diagnosis not present

## 2023-01-19 DIAGNOSIS — M205X2 Other deformities of toe(s) (acquired), left foot: Secondary | ICD-10-CM | POA: Diagnosis not present

## 2023-01-19 DIAGNOSIS — M792 Neuralgia and neuritis, unspecified: Secondary | ICD-10-CM | POA: Diagnosis not present

## 2023-01-19 DIAGNOSIS — M21621 Bunionette of right foot: Secondary | ICD-10-CM | POA: Diagnosis not present

## 2023-01-19 DIAGNOSIS — M21622 Bunionette of left foot: Secondary | ICD-10-CM | POA: Diagnosis not present

## 2023-01-19 DIAGNOSIS — L851 Acquired keratosis [keratoderma] palmaris et plantaris: Secondary | ICD-10-CM | POA: Diagnosis not present

## 2023-01-19 DIAGNOSIS — M205X1 Other deformities of toe(s) (acquired), right foot: Secondary | ICD-10-CM | POA: Diagnosis not present

## 2023-02-11 ENCOUNTER — Other Ambulatory Visit: Payer: Self-pay | Admitting: Internal Medicine

## 2023-02-11 DIAGNOSIS — Z1231 Encounter for screening mammogram for malignant neoplasm of breast: Secondary | ICD-10-CM

## 2023-02-16 DIAGNOSIS — H25041 Posterior subcapsular polar age-related cataract, right eye: Secondary | ICD-10-CM | POA: Diagnosis not present

## 2023-02-16 DIAGNOSIS — H2512 Age-related nuclear cataract, left eye: Secondary | ICD-10-CM | POA: Diagnosis not present

## 2023-02-16 DIAGNOSIS — H2511 Age-related nuclear cataract, right eye: Secondary | ICD-10-CM | POA: Diagnosis not present

## 2023-02-16 DIAGNOSIS — H268 Other specified cataract: Secondary | ICD-10-CM | POA: Diagnosis not present

## 2023-02-23 DIAGNOSIS — H2511 Age-related nuclear cataract, right eye: Secondary | ICD-10-CM | POA: Diagnosis not present

## 2023-02-23 DIAGNOSIS — H268 Other specified cataract: Secondary | ICD-10-CM | POA: Diagnosis not present

## 2023-03-16 DIAGNOSIS — M205X2 Other deformities of toe(s) (acquired), left foot: Secondary | ICD-10-CM | POA: Diagnosis not present

## 2023-03-16 DIAGNOSIS — M792 Neuralgia and neuritis, unspecified: Secondary | ICD-10-CM | POA: Diagnosis not present

## 2023-03-16 DIAGNOSIS — M205X1 Other deformities of toe(s) (acquired), right foot: Secondary | ICD-10-CM | POA: Diagnosis not present

## 2023-03-16 DIAGNOSIS — L851 Acquired keratosis [keratoderma] palmaris et plantaris: Secondary | ICD-10-CM | POA: Diagnosis not present

## 2023-03-16 DIAGNOSIS — M21621 Bunionette of right foot: Secondary | ICD-10-CM | POA: Diagnosis not present

## 2023-03-16 DIAGNOSIS — M21622 Bunionette of left foot: Secondary | ICD-10-CM | POA: Diagnosis not present

## 2023-03-29 ENCOUNTER — Ambulatory Visit
Admission: RE | Admit: 2023-03-29 | Discharge: 2023-03-29 | Disposition: A | Discharge status: Home / Self Care | Payer: Medicare PPO | Source: Ambulatory Visit | Attending: Internal Medicine | Admitting: Internal Medicine

## 2023-03-29 DIAGNOSIS — Z1231 Encounter for screening mammogram for malignant neoplasm of breast: Secondary | ICD-10-CM | POA: Diagnosis not present

## 2023-05-19 DIAGNOSIS — E782 Mixed hyperlipidemia: Secondary | ICD-10-CM | POA: Diagnosis not present

## 2023-05-19 DIAGNOSIS — E559 Vitamin D deficiency, unspecified: Secondary | ICD-10-CM | POA: Diagnosis not present

## 2023-05-19 DIAGNOSIS — Z0000 Encounter for general adult medical examination without abnormal findings: Secondary | ICD-10-CM | POA: Diagnosis not present

## 2023-05-19 DIAGNOSIS — R5383 Other fatigue: Secondary | ICD-10-CM | POA: Diagnosis not present

## 2023-05-19 DIAGNOSIS — M8580 Other specified disorders of bone density and structure, unspecified site: Secondary | ICD-10-CM | POA: Diagnosis not present

## 2023-05-19 DIAGNOSIS — Z23 Encounter for immunization: Secondary | ICD-10-CM | POA: Diagnosis not present

## 2023-05-26 DIAGNOSIS — E559 Vitamin D deficiency, unspecified: Secondary | ICD-10-CM | POA: Diagnosis not present

## 2023-05-26 DIAGNOSIS — Z0000 Encounter for general adult medical examination without abnormal findings: Secondary | ICD-10-CM | POA: Diagnosis not present

## 2023-05-26 DIAGNOSIS — E782 Mixed hyperlipidemia: Secondary | ICD-10-CM | POA: Diagnosis not present

## 2023-05-26 DIAGNOSIS — M8580 Other specified disorders of bone density and structure, unspecified site: Secondary | ICD-10-CM | POA: Diagnosis not present

## 2023-07-01 ENCOUNTER — Ambulatory Visit (HOSPITAL_BASED_OUTPATIENT_CLINIC_OR_DEPARTMENT_OTHER): Admitting: Student

## 2023-07-01 ENCOUNTER — Ambulatory Visit (HOSPITAL_BASED_OUTPATIENT_CLINIC_OR_DEPARTMENT_OTHER)

## 2023-07-01 ENCOUNTER — Encounter (HOSPITAL_BASED_OUTPATIENT_CLINIC_OR_DEPARTMENT_OTHER): Payer: Self-pay | Admitting: Student

## 2023-07-01 DIAGNOSIS — S82002A Unspecified fracture of left patella, initial encounter for closed fracture: Secondary | ICD-10-CM | POA: Diagnosis not present

## 2023-07-01 DIAGNOSIS — S82035A Nondisplaced transverse fracture of left patella, initial encounter for closed fracture: Secondary | ICD-10-CM

## 2023-07-01 DIAGNOSIS — M25562 Pain in left knee: Secondary | ICD-10-CM

## 2023-07-01 DIAGNOSIS — M25462 Effusion, left knee: Secondary | ICD-10-CM | POA: Diagnosis not present

## 2023-07-01 NOTE — Progress Notes (Signed)
 Chief Complaint: Left knee pain    Discussed the use of AI scribe software for clinical note transcription with the patient, who gave verbal consent to proceed.  History of Present Illness Krista Horn is a 73 year old female who presents with knee pain after a fall.  She fell at 8:00 AM today after tripping on a dog leash, landing on her knee and then on her back. Immediate pain in the knee and calf occurred, but she was able to walk home. The knee feels weak, similar to a previous medial meniscus tear, though not exactly the same. Discomfort occurs when straightening the leg, but this has improved since the fall. Tenderness is present, especially anteriorly. Ice has been applied to the knee. There is no numbness or tingling.  She had a medial meniscus tear from a skiing accident in her early twenties, which was surgically repaired. No significant knee issues since, aside from occasional cracking sounds. She has osteopenia diagnosed at age 20, stable over the years, and takes calcium and vitamin D supplements.   Surgical History:   None  PMH/PSH/Family History/Social History/Meds/Allergies:    Past Medical History:  Diagnosis Date   Breast cancer (HCC) 1996   Personal history of radiation therapy    Past Surgical History:  Procedure Laterality Date   BREAST LUMPECTOMY Left 1996   Social History   Socioeconomic History   Marital status: Married    Spouse name: Not on file   Number of children: Not on file   Years of education: Not on file   Highest education level: Not on file  Occupational History   Not on file  Tobacco Use   Smoking status: Never   Smokeless tobacco: Never  Substance and Sexual Activity   Alcohol use: Not on file   Drug use: Not on file   Sexual activity: Not on file  Other Topics Concern   Not on file  Social History Narrative   Not on file   Social Drivers of Health   Financial Resource Strain: Not on file   Food Insecurity: Not on file  Transportation Needs: Not on file  Physical Activity: Not on file  Stress: Not on file  Social Connections: Not on file   Family History  Problem Relation Age of Onset   Breast cancer Mother    Breast cancer Paternal Grandmother    No Known Allergies Current Outpatient Medications  Medication Sig Dispense Refill   neomycin -polymyxin-hydrocortisone (CORTISPORIN) OTIC solution Place 4 drops into the left ear 4 (four) times daily. Use 4-5-drops in ear for pain for 1 week. 10 mL 0   No current facility-administered medications for this visit.   No results found.  Review of Systems:   A ROS was performed including pertinent positives and negatives as documented in the HPI.  Physical Exam :   Constitutional: NAD and appears stated age Neurological: Alert and oriented Psych: Appropriate affect and cooperative There were no vitals taken for this visit.   Comprehensive Musculoskeletal Exam:    Exam of the left knee demonstrates no visible deformity.  Tenderness with palpation over the inferior patella and tibial tubercle.  Extensor mechanism is intact with active extension to approximately 5 degrees.  Flexion to 90 degrees.  Stable collaterals with varus and valgus stress.  No significant medial  or lateral joint line tenderness.  Imaging:   Xray (left knee 4 views): Nondisplaced transverse fracture of the inferior patella.  Prior surgical hardware noted at the medial tibial plateau.   I personally reviewed and interpreted the radiographs.      Assessment & Plan Nondisplaced fracture of the patella   Patient demonstrates a transverse, nondisplaced fracture at the inferior pole of the patella due to a direct fall onto the knee earlier today.  Extensor mechanism is intact and she has been able to tolerate weightbearing although has been using crutches for assistance.  Given the nature of the fracture, I believe this can be managed nonoperatively  however I would like to place her in a hinged knee brace locked in extension in order to protect the fracture and promote healing.  She can weight-bear as tolerated.  Will plan to have her return to clinic in approximately 4 weeks for reassessment and repeat x-ray.       I personally saw and evaluated the patient, and participated in the management and treatment plan.  Sharrell Deck, PA-C Orthopedics

## 2023-07-12 ENCOUNTER — Ambulatory Visit (HOSPITAL_BASED_OUTPATIENT_CLINIC_OR_DEPARTMENT_OTHER): Admitting: Student

## 2023-07-26 ENCOUNTER — Ambulatory Visit (HOSPITAL_BASED_OUTPATIENT_CLINIC_OR_DEPARTMENT_OTHER)

## 2023-07-26 ENCOUNTER — Ambulatory Visit (HOSPITAL_BASED_OUTPATIENT_CLINIC_OR_DEPARTMENT_OTHER): Admitting: Student

## 2023-07-26 ENCOUNTER — Encounter (HOSPITAL_BASED_OUTPATIENT_CLINIC_OR_DEPARTMENT_OTHER): Payer: Self-pay | Admitting: Student

## 2023-07-26 DIAGNOSIS — M25462 Effusion, left knee: Secondary | ICD-10-CM | POA: Diagnosis not present

## 2023-07-26 DIAGNOSIS — S82035A Nondisplaced transverse fracture of left patella, initial encounter for closed fracture: Secondary | ICD-10-CM

## 2023-07-26 DIAGNOSIS — M85862 Other specified disorders of bone density and structure, left lower leg: Secondary | ICD-10-CM | POA: Diagnosis not present

## 2023-07-26 DIAGNOSIS — M25562 Pain in left knee: Secondary | ICD-10-CM | POA: Diagnosis not present

## 2023-07-26 NOTE — Progress Notes (Signed)
 Chief Complaint: Left knee pain    History of Present Illness  07/26/23: Patient presents today for follow-up evaluation of her left knee.  She sustained a nondisplaced patella fracture approximately 4 weeks ago due to a fall.  She has been weightbearing well with the hinged brace locked in extension.  She reports no pain without any use of pain medication.  No more recent falls since the initial injury.   07/01/23: Krista Horn is a 73 year old female who presents with knee pain after a fall. She fell at 8:00 AM today after tripping on a dog leash, landing on her knee and then on her back. Immediate pain in the knee and calf occurred, but she was able to walk home. The knee feels weak, similar to a previous medial meniscus tear, though not exactly the same. Discomfort occurs when straightening the leg, but this has improved since the fall. Tenderness is present, especially anteriorly. Ice has been applied to the knee. There is no numbness or tingling. She had a medial meniscus tear from a skiing accident in her early twenties, which was surgically repaired. No significant knee issues since, aside from occasional cracking sounds. She has osteopenia diagnosed at age 21, stable over the years, and takes calcium and vitamin D supplements.   Surgical History:   None  PMH/PSH/Family History/Social History/Meds/Allergies:    Past Medical History:  Diagnosis Date   Breast cancer (HCC) 1996   Personal history of radiation therapy    Past Surgical History:  Procedure Laterality Date   BREAST LUMPECTOMY Left 1996   Social History   Socioeconomic History   Marital status: Married    Spouse name: Not on file   Number of children: Not on file   Years of education: Not on file   Highest education level: Not on file  Occupational History   Not on file  Tobacco Use   Smoking status: Never   Smokeless tobacco: Never  Substance and Sexual Activity   Alcohol  use: Not on file   Drug use: Not on file   Sexual activity: Not on file  Other Topics Concern   Not on file  Social History Narrative   Not on file   Social Drivers of Health   Financial Resource Strain: Not on file  Food Insecurity: Not on file  Transportation Needs: Not on file  Physical Activity: Not on file  Stress: Not on file  Social Connections: Not on file   Family History  Problem Relation Age of Onset   Breast cancer Mother    Breast cancer Paternal Grandmother    No Known Allergies Current Outpatient Medications  Medication Sig Dispense Refill   neomycin -polymyxin-hydrocortisone (CORTISPORIN) OTIC solution Place 4 drops into the left ear 4 (four) times daily. Use 4-5-drops in ear for pain for 1 week. 10 mL 0   No current facility-administered medications for this visit.   No results found.  Review of Systems:   A ROS was performed including pertinent positives and negatives as documented in the HPI.  Physical Exam :   Constitutional: NAD and appears stated age Neurological: Alert and oriented Psych: Appropriate affect and cooperative There were no vitals taken for this visit.   Comprehensive Musculoskeletal Exam:    Left knee examination demonstrates active range of motion from  approximately 5 degrees extension to 90 degrees flexion.  No tenderness over the patella or bilateral joint lines.  No laxity with varus or valgus stress.  Minimal effusion present and no erythema or ecchymosis noted.  Imaging:   Xray (left knee 4 views): Healing nondisplaced transverse fracture of the inferior patella.  Fracture line remains slightly visualized.   I personally reviewed and interpreted the radiographs.      Assessment & Plan Nondisplaced fracture of the patella   Patient is 4 weeks status post nondisplaced left patella fracture.  She has been weightbearing as tolerated with a hinged brace in full extension and seems to be doing very well with this.  She is not  experiencing any pain at this time.  X-rays today do show good progression of healing at the fracture site.  Given this I would like to keep her in the brace for a few more weeks but will allow brace to be unlocked to allow flexion to 90 degrees.  She seemed to tolerate this well and was ambulating without difficulty.  Plan will be to reassess and repeat x-rays in another 3 weeks, at which time I hope she will be able to transition out of the brace completely.       I personally saw and evaluated the patient, and participated in the management and treatment plan.  Leonce Reveal, PA-C Orthopedics

## 2023-07-27 DIAGNOSIS — M8589 Other specified disorders of bone density and structure, multiple sites: Secondary | ICD-10-CM | POA: Diagnosis not present

## 2023-08-02 DIAGNOSIS — H26491 Other secondary cataract, right eye: Secondary | ICD-10-CM | POA: Diagnosis not present

## 2023-08-02 DIAGNOSIS — H02831 Dermatochalasis of right upper eyelid: Secondary | ICD-10-CM | POA: Diagnosis not present

## 2023-08-02 DIAGNOSIS — H26493 Other secondary cataract, bilateral: Secondary | ICD-10-CM | POA: Diagnosis not present

## 2023-08-02 DIAGNOSIS — Z961 Presence of intraocular lens: Secondary | ICD-10-CM | POA: Diagnosis not present

## 2023-08-16 ENCOUNTER — Ambulatory Visit (HOSPITAL_BASED_OUTPATIENT_CLINIC_OR_DEPARTMENT_OTHER): Admitting: Physician Assistant

## 2023-08-16 ENCOUNTER — Other Ambulatory Visit (HOSPITAL_BASED_OUTPATIENT_CLINIC_OR_DEPARTMENT_OTHER): Payer: Self-pay | Admitting: Physician Assistant

## 2023-08-16 ENCOUNTER — Ambulatory Visit (INDEPENDENT_AMBULATORY_CARE_PROVIDER_SITE_OTHER)

## 2023-08-16 ENCOUNTER — Encounter (HOSPITAL_BASED_OUTPATIENT_CLINIC_OR_DEPARTMENT_OTHER): Payer: Self-pay | Admitting: Physician Assistant

## 2023-08-16 DIAGNOSIS — M25562 Pain in left knee: Secondary | ICD-10-CM

## 2023-08-16 DIAGNOSIS — M25462 Effusion, left knee: Secondary | ICD-10-CM | POA: Diagnosis not present

## 2023-08-16 NOTE — Progress Notes (Signed)
   Office Visit Note   Patient: Krista Horn           Date of Birth: 1950/10/31           MRN: [id number] Visit Date: 08/16/2023              Requested by: Verdia Lombard, MD [address] [place],  [place] [address] PCP: Verdia Lombard, MD  Chief Complaint  Patient presents with   Left Knee - Follow-up      HPI: Patient is a 73 year old woman who is in follow-up for her left nondisplaced patella fracture.  She is now 6 weeks she is doing very well no complaints  Assessment & Plan: Visit Diagnoses:  1. Acute pain of left knee     Plan: She may discontinue the brace.  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   Ortho Exam  Patient is alert, oriented, no adenopathy, well-dressed, normal affect, normal respiratory effort. No pain with extension or flexion nontender over the patella.  Compartments soft and compressible she does have quite a bit of quad atrophy    Imaging: No results found. No images are attached to the encounter.  Labs: No results found for: HGBA1C, ESRSEDRATE, CRP, LABURIC, REPTSTATUS, GRAMSTAIN, CULT, LABORGA   No results found for: ALBUMIN, PREALBUMIN, CBC  No results found for: MG No results found for: VD25OH  No results found for: PREALBUMIN     No data to display           There is no height or weight on file to calculate BMI.  Orders:  Orders Placed This Encounter  Procedures   Ambulatory referral to Physical Therapy   No orders of the defined types were placed in this encounter.    Procedures: No procedures performed  Clinical Data: No additional findings.  ROS:  All other systems negative, except as noted in the HPI. Review of Systems  Objective: Vital Signs: There were no vitals taken for this  visit.  Specialty Comments:  No specialty comments available.  PMFS History: There are no active problems to display for this patient.  Past Medical History:  Diagnosis Date   Breast cancer (HCC) 1996   Personal history of radiation therapy     Family History  Problem Relation Age of Onset   Breast cancer Mother    Breast cancer Paternal Grandmother     Past Surgical History:  Procedure Laterality Date   BREAST LUMPECTOMY Left 1996   Social History   Occupational History   Not on file  Tobacco Use   Smoking status: Never   Smokeless tobacco: Never  Substance and Sexual Activity   Alcohol use: Not on file   Drug use: Not on file   Sexual activity: Not on file

## 2023-08-23 DIAGNOSIS — H26492 Other secondary cataract, left eye: Secondary | ICD-10-CM | POA: Diagnosis not present

## 2023-08-25 ENCOUNTER — Other Ambulatory Visit: Payer: Self-pay

## 2023-08-25 ENCOUNTER — Ambulatory Visit

## 2023-08-25 DIAGNOSIS — M25562 Pain in left knee: Secondary | ICD-10-CM | POA: Diagnosis not present

## 2023-08-25 DIAGNOSIS — M6281 Muscle weakness (generalized): Secondary | ICD-10-CM | POA: Diagnosis not present

## 2023-08-25 NOTE — Therapy (Signed)
 OUTPATIENT PHYSICAL THERAPY LOWER EXTREMITY EVALUATION   Patient Name: Krista Horn MRN: [id number] DOB:10-02-1950, 73 y.o., female Today's Date: 08/26/2023  END OF SESSION:  PT End of Session - 08/26/23 1126     Visit Number 1    Number of Visits 4    Date for PT Re-Evaluation 09/23/23    Authorization Type [employer]    PT Start Time 1430    PT Stop Time 1510    PT Time Calculation (min) 40 min    Activity Tolerance Patient tolerated treatment well    Behavior During Therapy WFL for tasks assessed/performed          Past Medical History:  Diagnosis Date   Breast cancer (HCC) 1996   Personal history of radiation therapy    Past Surgical History:  Procedure Laterality Date   BREAST LUMPECTOMY Left 1996   There are no active problems to display for this patient.   PCP: Verdia Lombard, MD   REFERRING PROVIDER: Persons, Ronal Dragon, [place]   REFERRING DIAG: [phone] (ICD-10-CM) - Acute pain of left knee   THERAPY DIAG:  Acute pain of left knee  Muscle weakness (generalized)  Rationale for Evaluation and Treatment: Rehabilitation  ONSET DATE: Couple months ago  SUBJECTIVE:   SUBJECTIVE STATEMENT: Couple months ago was walking big dogs and got tangled up and tripped falling onto L patella.  To MD next day, braced straight for 3-4 weeks.  After followup was unlocked.  Now feeling minimal pain but restricted activities.    PERTINENT HISTORY: L meniscus surgery 30+ years ago PAIN:  Are you having pain? Yes: NPRS scale: 1/10 Pain location: medial side Pain description: dull Aggravating factors: kneeling, bending Relieving factors: rest  PRECAUTIONS: None  RED FLAGS: None   WEIGHT BEARING RESTRICTIONS: No  FALLS:  Has patient fallen in last 6 months? Yes. Number of falls 1  LIVING ENVIRONMENT: Lives with: lives with their family Lives in: House/apartment Stairs: Yes: Internal: 5 steps; 1 Has following equipment at home: None  OCCUPATION:  retired  PLOF: Independent  PATIENT GOALS: Improve bending painfree  NEXT MD VISIT: unknown  OBJECTIVE:  Note: Objective measures were completed at Evaluation unless otherwise noted.  DIAGNOSTIC FINDINGS: Imaging  07/10/23 IMPRESSION: Transverse nondisplaced fracture through the inferior patellar pole with minimal joint effusion.    PATIENT SURVEYS:  PSFS: THE PATIENT SPECIFIC FUNCTIONAL SCALE  Place score of 0-10 (0 = unable to perform activity and 10 = able to perform activity at the same level as before injury or problem)  Activity Date: 08/25/23    Stairs 7    2.Kneeling for gardening 7    3.Pain after gardening 8    4.      Total Score 7.33      Total Score = Sum of activity scores/number of activities  Minimally Detectable Change: 3 points (for single activity); 2 points (for average score)  [name] (nd). The Patient Specific Functional Scale . Retrieved from [email or web address]   COGNITION: Overall cognitive status: Within functional limits for tasks assessed     SENSATION: WFL  EDEMA:  Not measured  MUSCLE LENGTH: Hamstrings: Left mild restriction    POSTURE: No Significant postural limitations  PALPATION: No palpable tenderness  LOWER EXTREMITY ROM:  Active ROM Right eval Left eval  Hip flexion    Hip extension    Hip abduction    Hip adduction    Hip internal rotation    Hip external rotation  Knee flexion  135  Knee extension  0  Ankle dorsiflexion    Ankle plantarflexion    Ankle inversion    Ankle eversion     (Blank rows = not tested)  LOWER EXTREMITY MMT:  MMT Right eval Left eval  Hip flexion  4+  Hip extension    Hip abduction    Hip adduction    Hip internal rotation    Hip external rotation    Knee flexion  5-  Knee extension  4  Ankle dorsiflexion    Ankle plantarflexion    Ankle inversion    Ankle eversion     (Blank rows = not  tested)  LOWER EXTREMITY SPECIAL TESTS:  No special tests completed  FUNCTIONAL TESTS:  Not completed  GAIT: Distance walked: community Assistive device utilized: None Level of assistance: Complete Independence                                                                                                                                 TREATMENT DATE:  08/25/23 Evaluation completed followed by instruction in HEP.   PATIENT EDUCATION:  Education details: HEP Person educated: Patient Education method: Explanation, Demonstration, Actor cues, and Verbal cues Education comprehension: verbalized understanding, returned demonstration, verbal cues required, and tactile cues required  HOME EXERCISE PROGRAM:  Access Code: [email or web address] URL: [email or web address] Date: 08/25/2023 Prepared by: Burnard Meth  Exercises - Supine Quad Set  - 2 x daily - 7 x weekly - 2 sets - 10 reps - 3 hold - Active Straight Leg Raise with Quad Set  - 2 x daily - 7 x weekly - 2 sets - 10 reps - Sidelying Hip Abduction  - 7 x weekly - 2 sets - 10 reps - Supine Short Arc Quad  - 2 x daily - 7 x weekly - 2 sets - 10 reps - 2 hold - Supine Hip Adduction Isometric with Ball  - 2 x daily - 7 x weekly - 2 sets - 10 reps - 5 hold - Mini Squat with Counter Support  - 2 x daily - 7 x weekly - 2 sets - 10 reps - Step Up  - 2 x daily - 7 x weekly - 2 sets - 10 reps ASSESSMENT:  CLINICAL IMPRESSION: Patient is a 73y.o. female who was seen today for physical therapy evaluation and treatment for recovery from L patella fracture. She presents with some pain with kneeling and decreased strength.  She would benefit from skilled PT to address these deficits and return to previous LOA.    OBJECTIVE IMPAIRMENTS: decreased strength and pain.   ACTIVITY LIMITATIONS: bending and kneeling  PARTICIPATION LIMITATIONS: gardening  PERSONAL FACTORS none  REHAB POTENTIAL: Excellent  CLINICAL DECISION MAKING:  Stable/uncomplicated  EVALUATION COMPLEXITY: Low   GOALS: Goals reviewed with patient? Yes  SHORT TERM GOALS: Target date: 09/09/2023   Pt to be independent with HEP. Baseline: Goal status: INITIAL  LONG TERM GOALS: Target date: 09/23/2023    Pt to be independent with self progressive HEP. Baseline:  Goal status: INITIAL  2.  Pt able to kneel in the garden with foam pad for > 40 minutes with no pain. Baseline:  Goal status: INITIAL  3.  Increase LE strength by at least 1/2 grade or more. Baseline:  Goal status: INITIAL  PLAN:  PT FREQUENCY: 1x/week  PT DURATION: 4 weeks  PLANNED INTERVENTIONS: 97164- PT Re-evaluation, 97110-Therapeutic exercises, 97530- Therapeutic activity, 97112- Neuromuscular re-education, 97535- Self Care, 02859- Manual therapy, Patient/Family education, Balance training, Cryotherapy, and Moist heat  PLAN FOR NEXT SESSION: Review and increase HEP.   Burnard CHRISTELLA Meth, PT 08/26/2023, 11:27 AM

## 2023-08-30 ENCOUNTER — Ambulatory Visit: Admitting: Rehabilitative and Restorative Service Providers"

## 2023-09-01 ENCOUNTER — Ambulatory Visit: Admitting: Rehabilitative and Restorative Service Providers"

## 2023-09-06 ENCOUNTER — Ambulatory Visit: Admitting: Rehabilitative and Restorative Service Providers"

## 2023-09-07 NOTE — Therapy (Addendum)
 OUTPATIENT PHYSICAL THERAPY LOWER EXTREMITY TREATMENT   Patient Name: Krista Horn MRN: [id number] DOB:[birth year], 73 y.o., female Today's Date: 09/08/2023  END OF SESSION:  PT End of Session - 09/08/23 0818     Visit Number 2    Number of Visits 4    Date for PT Re-Evaluation 09/23/23    Authorization Type [employer]    PT Start Time 0805    PT Stop Time 0843    PT Time Calculation (min) 38 min    Activity Tolerance Patient tolerated treatment well    Behavior During Therapy [facility] for tasks assessed/performed           Past Medical History:  Diagnosis Date   Breast cancer (HCC) 1996   Personal history of radiation therapy    Past Surgical History:  Procedure Laterality Date   BREAST LUMPECTOMY Left 1996   There are no active problems to display for this patient.   PCP: Verdia Lombard, MD   REFERRING PROVIDER: Persons, Ronal Dragon, [place]   REFERRING DIAG: [phone] (ICD-10-CM) - Acute pain of left knee   THERAPY DIAG:  Acute pain of left knee  Muscle weakness (generalized)  Rationale for Evaluation and Treatment: Rehabilitation  ONSET DATE: Couple months ago  SUBJECTIVE:   SUBJECTIVE STATEMENT: Pt [place].  Tolerated well. PERTINENT HISTORY: L meniscus surgery 30+ years ago PAIN:  Are you having pain? Yes: NPRS scale: 0/10 Pain location: medial side Pain description: dull Aggravating factors: kneeling, bending Relieving factors: rest  PRECAUTIONS: None  RED FLAGS: None   WEIGHT BEARING RESTRICTIONS: No  FALLS:  Has patient fallen in last 6 months? Yes. Number of falls 1  LIVING ENVIRONMENT: Lives with: lives with their family Lives in: House/apartment Stairs: Yes: Internal: 5 steps; 1 Has following equipment at home: None  OCCUPATION: retired  PLOF: Independent  PATIENT GOALS: Improve bending painfree  NEXT MD VISIT: unknown  OBJECTIVE:  Note: Objective measures were completed at Evaluation  unless otherwise noted.  DIAGNOSTIC FINDINGS: Imaging  07/10/23 IMPRESSION: Transverse nondisplaced fracture through the inferior patellar pole with minimal joint effusion.    PATIENT SURVEYS:  PSFS: THE PATIENT SPECIFIC FUNCTIONAL SCALE  Place score of 0-10 (0 = unable to perform activity and 10 = able to perform activity at the same level as before injury or problem)  Activity Date: 08/25/23    Stairs 7    2.Kneeling for gardening 7    3.Pain after gardening 8    4.      Total Score 7.33      Total Score = Sum of activity scores/number of activities  Minimally Detectable Change: 3 points (for single activity); 2 points (for average score)  [name] (nd). The Patient Specific Functional Scale . Retrieved from [email or web address]   COGNITION: Overall cognitive status: Within functional limits for tasks assessed     SENSATION: WFL  EDEMA:  Not measured  MUSCLE LENGTH: Hamstrings: Left mild restriction    POSTURE: No Significant postural limitations  PALPATION: No palpable tenderness  LOWER EXTREMITY ROM:  Active ROM Right eval Left eval  Hip flexion    Hip extension    Hip abduction    Hip adduction    Hip internal rotation    Hip external rotation    Knee flexion  135  Knee extension  0  Ankle dorsiflexion    Ankle plantarflexion    Ankle inversion    Ankle eversion     (  Blank rows = not tested)  LOWER EXTREMITY MMT:  MMT Right eval Left eval  Hip flexion  4+  Hip extension    Hip abduction    Hip adduction    Hip internal rotation    Hip external rotation    Knee flexion  5-  Knee extension  4  Ankle dorsiflexion    Ankle plantarflexion    Ankle inversion    Ankle eversion     (Blank rows = not tested)  LOWER EXTREMITY SPECIAL TESTS:  No special tests completed  FUNCTIONAL TESTS:  Not completed  GAIT: Distance walked: community Assistive device utilized:  None Level of assistance: Complete Independence                                                                                                                                 TREATMENT DATE:  09/07/23 HEP review Quad sets 2x10 SLR/SR  2x10 #1 SAQ  2x10  #1 Ball squeeze  10x10 sec Squats  3x10 with 10#KB Step ups  2x10 Hip abduction with Green band 3x10 Balance on airex 2x30 EO/EC Steps onto airex 20x2    08/25/23 Evaluation completed followed by instruction in HEP.   PATIENT EDUCATION:  Education details: HEP Person educated: Patient Education method: Explanation, Demonstration, Actor cues, and Verbal cues Education comprehension: verbalized understanding, returned demonstration, verbal cues required, and tactile cues required  HOME EXERCISE PROGRAM:  Access Code: [email or web address] URL: [email or web address] Date: 08/25/2023 Prepared by: Burnard Meth  Exercises - Supine Quad Set  - 2 x daily - 7 x weekly - 2 sets - 10 reps - 3 hold - Active Straight Leg Raise with Quad Set  - 2 x daily - 7 x weekly - 2 sets - 10 reps - Sidelying Hip Abduction  - 7 x weekly - 2 sets - 10 reps - Supine Short Arc Quad  - 2 x daily - 7 x weekly - 2 sets - 10 reps - 2 hold - Supine Hip Adduction Isometric with Ball  - 2 x daily - 7 x weekly - 2 sets - 10 reps - 5 hold - Mini Squat with Counter Support  - 2 x daily - 7 x weekly - 2 sets - 10 reps - Step Up  - 2 x daily - 7 x weekly - 2 sets - 10 reps ASSESSMENT:  CLINICAL IMPRESSION:Pt needed VC for form in HEP review.  Demonstrated understanding.    OBJECTIVE IMPAIRMENTS: decreased strength and pain.   ACTIVITY LIMITATIONS: bending and kneeling  PARTICIPATION LIMITATIONS: gardening  PERSONAL FACTORS none  REHAB POTENTIAL: Excellent  CLINICAL DECISION MAKING: Stable/uncomplicated  EVALUATION COMPLEXITY: Low   GOALS: Goals reviewed with patient? Yes  SHORT TERM GOALS: Target date: 09/09/2023   Pt to be independent with  HEP. Baseline: Goal status: INITIAL   LONG TERM GOALS: Target date: 09/23/2023    Pt to be independent with self progressive HEP. Baseline:  Goal  status: INITIAL  2.  Pt able to kneel in the garden with foam pad for > 40 minutes with no pain. Baseline:  Goal status: INITIAL  3.  Increase LE strength by at least 1/2 grade or more. Baseline:  Goal status: INITIAL  PLAN:  PT FREQUENCY: 1x/week  PT DURATION: 4 weeks  PLANNED INTERVENTIONS: 97164- PT Re-evaluation, 97110-Therapeutic exercises, 97530- Therapeutic activity, 97112- Neuromuscular re-education, 97535- Self Care, 02859- Manual therapy, Patient/Family education, Balance training, Cryotherapy, and Moist heat  PLAN FOR NEXT SESSION: Review and increase HEP.   Burnard CHRISTELLA Meth, PT 09/08/2023, 8:20 AM

## 2023-09-08 ENCOUNTER — Ambulatory Visit

## 2023-09-08 DIAGNOSIS — M25562 Pain in left knee: Secondary | ICD-10-CM

## 2023-09-08 DIAGNOSIS — M6281 Muscle weakness (generalized): Secondary | ICD-10-CM

## 2023-09-19 NOTE — Therapy (Signed)
 OUTPATIENT PHYSICAL THERAPY LOWER EXTREMITY TREATMENT/DISCHARGE NOTE   Patient Name: Krista Horn MRN: [id number] DOB:11/21/1950, 73 y.o., female Today's Date: 09/19/2023  END OF SESSION:***     Past Medical History:  Diagnosis Date   Breast cancer (HCC) 1996   Personal history of radiation therapy    Past Surgical History:  Procedure Laterality Date   BREAST LUMPECTOMY Left 1996   There are no active problems to display for this patient.   PCP: Verdia Lombard, MD   REFERRING PROVIDER: Verdia Lombard, MD   REFERRING DIAG: [phone] (ICD-10-CM) - Acute pain of left knee   THERAPY DIAG:  No diagnosis found.  Rationale for Evaluation and Treatment: Rehabilitation  ONSET DATE: Couple months ago  SUBJECTIVE:   SUBJECTIVE STATEMENT: Pt reports feeling no restrictions with any activities.  No clicking felt in the knee.  PERTINENT HISTORY: L meniscus surgery 30+ years ago PAIN:  Are you having pain? Yes: NPRS scale: 0/10 Pain location: medial side Pain description: dull Aggravating factors: kneeling, bending Relieving factors: rest  PRECAUTIONS: None  RED FLAGS: None   WEIGHT BEARING RESTRICTIONS: No  FALLS:  Has patient fallen in last 6 months? Yes. Number of falls 1  LIVING ENVIRONMENT: Lives with: lives with their family Lives in: House/apartment Stairs: Yes: Internal: 5 steps; 1 Has following equipment at home: None  OCCUPATION: retired  PLOF: Independent  PATIENT GOALS: Improve bending painfree  NEXT MD VISIT: unknown  OBJECTIVE:  Note: Objective measures were completed at Evaluation unless otherwise noted.  DIAGNOSTIC FINDINGS: Imaging  07/10/23 IMPRESSION: Transverse nondisplaced fracture through the inferior patellar pole with minimal joint effusion.    PATIENT SURVEYS:  PSFS: THE PATIENT SPECIFIC FUNCTIONAL SCALE  Place score of 0-10 (0 = unable to perform activity and 10 = able to perform activity at the same level as  before injury or problem)  Activity Date: 08/25/23 Date: 09/21/23   Stairs 7 10   2.Kneeling for gardening 7 9   3.Pain after gardening 8 10   4.      Total Score 7.33 ***     Total Score = Sum of activity scores/number of activities  Minimally Detectable Change: 3 points (for single activity); 2 points (for average score)  [name] (nd). The Patient Specific Functional Scale . Retrieved from [email or web address]   COGNITION: Overall cognitive status: Within functional limits for tasks assessed     SENSATION: WFL  EDEMA:  Not measured  MUSCLE LENGTH: Hamstrings: Left mild restriction    POSTURE: No Significant postural limitations  PALPATION: No palpable tenderness  LOWER EXTREMITY ROM:  Active ROM Right eval Left eval  Hip flexion    Hip extension    Hip abduction    Hip adduction    Hip internal rotation    Hip external rotation    Knee flexion  135  Knee extension  0  Ankle dorsiflexion    Ankle plantarflexion    Ankle inversion    Ankle eversion     (Blank rows = not tested)  LOWER EXTREMITY MMT:  MMT Right eval Left eval Left 09/21/23  Hip flexion  4+ 5  Hip extension     Hip abduction     Hip adduction     Hip internal rotation     Hip external rotation     Knee flexion  5- 5  Knee extension  4 5-  Ankle dorsiflexion     Ankle plantarflexion     Ankle inversion  Ankle eversion      (Blank rows = not tested)  LOWER EXTREMITY SPECIAL TESTS:  No special tests completed  FUNCTIONAL TESTS:  Not completed  GAIT: Distance walked: community Assistive device utilized: None Level of assistance: Complete Independence                                                                                                                                 TREATMENT DATE:  09/21/23*** Nustep level 5 8 min T band hip abduction green 3x10 Tband bridges  SLR/SR  2x10 #1 09/07/23 HEP  review Quad sets 2x10 SLR/SR  2x10 #1 SAQ  2x10  #1 Ball squeeze  10x10 sec Squats  3x10 with 10#KB Step ups  2x10 Hip abduction with Green band 3x10 Balance on airex 2x30 EO/EC Steps onto airex 20x2    08/25/23 Evaluation completed followed by instruction in HEP.   PATIENT EDUCATION:  Education details: HEP Person educated: Patient Education method: Explanation, Demonstration, Actor cues, and Verbal cues Education comprehension: verbalized understanding, returned demonstration, verbal cues required, and tactile cues required  HOME EXERCISE PROGRAM: Access Code: [email or web address] URL: [email or web address] Date: 09/21/2023 Prepared by: Burnard Meth  Exercises - Supine Quad Set  - 2 x daily - 7 x weekly - 2 sets - 10 reps - 3 hold - Active Straight Leg Raise with Quad Set  - 2 x daily - 7 x weekly - 2 sets - 10 reps - Sidelying Hip Abduction  - 7 x weekly - 2 sets - 10 reps - Supine Short Arc Quad  - 2 x daily - 7 x weekly - 2 sets - 10 reps - 2 hold - Supine Hip Adduction Isometric with Ball  - 2 x daily - 7 x weekly - 2 sets - 10 reps - 5 hold - Mini Squat with Counter Support  - 2 x daily - 7 x weekly - 2 sets - 10 reps - Step Up  - 2 x daily - 7 x weekly - 2 sets - 10 reps - Hooklying Clamshell with Resistance  - 1 x daily - 7 x weekly - 2 sets - 12 reps - Supine Bridge with Resistance Band  - 1 x daily - 7 x weekly - 2 sets - 12 reps - Tandem Stance  - 1 x daily - 7 x weekly - 1 sets - 6 reps - 30 hold - Single Leg Stance  - 1 x daily - 7 x weekly - 1 sets - 6 reps - 25 hold - Mini Squat with Counter Support  - 2 x daily - 7 x weekly - 2 sets - 10 reps - 2 hold  ASSESSMENT:  CLINICAL IMPRESSION:Pt needed VC for form in HEP review.  Demonstrated understanding.    OBJECTIVE IMPAIRMENTS: decreased strength and pain.   ACTIVITY LIMITATIONS: bending and kneeling  PARTICIPATION LIMITATIONS: gardening  PERSONAL FACTORS none  REHAB POTENTIAL:  Excellent  CLINICAL DECISION  MAKING: Stable/uncomplicated  EVALUATION COMPLEXITY: Low   GOALS: Goals reviewed with patient? Yes  SHORT TERM GOALS: Target date: 09/09/2023   Pt to be independent with HEP. Baseline: Goal status: MET   LONG TERM GOALS: Target date: 09/23/2023    Pt to be independent with self progressive HEP. Baseline:  Goal status: MET 09/21/23  2.  Pt able to kneel in the garden with foam pad for > 40 minutes with no pain. Baseline:  Goal status: MET 09/21/23  3.  Increase LE strength by at least 1/2 grade or more. Baseline:  Goal status: MET 09/21/23  PLAN:  PT FREQUENCY: 1x/week  PT DURATION: 4 weeks  PLANNED INTERVENTIONS: 97164- PT Re-evaluation, 97110-Therapeutic exercises, 97530- Therapeutic activity, 97112- Neuromuscular re-education, 97535- Self Care, [id number]- Manual therapy, Patient/Family education, Balance training, Cryotherapy, and Moist heat  PLAN FOR NEXT SESSION:*** Review and increase HEP.   Burnard CHRISTELLA Meth, PT 09/19/2023, 4:24 PM

## 2023-09-20 ENCOUNTER — Encounter

## 2023-09-21 ENCOUNTER — Ambulatory Visit

## 2023-09-21 DIAGNOSIS — M6281 Muscle weakness (generalized): Secondary | ICD-10-CM

## 2023-09-21 DIAGNOSIS — M25562 Pain in left knee: Secondary | ICD-10-CM | POA: Diagnosis not present

## 2023-10-12 DIAGNOSIS — L821 Other seborrheic keratosis: Secondary | ICD-10-CM | POA: Diagnosis not present

## 2023-10-12 DIAGNOSIS — L300 Nummular dermatitis: Secondary | ICD-10-CM | POA: Diagnosis not present

## 2023-10-12 DIAGNOSIS — L578 Other skin changes due to chronic exposure to nonionizing radiation: Secondary | ICD-10-CM | POA: Diagnosis not present

## 2023-10-12 DIAGNOSIS — L814 Other melanin hyperpigmentation: Secondary | ICD-10-CM | POA: Diagnosis not present

## 2023-10-12 DIAGNOSIS — I781 Nevus, non-neoplastic: Secondary | ICD-10-CM | POA: Diagnosis not present

## 2023-10-12 DIAGNOSIS — L568 Other specified acute skin changes due to ultraviolet radiation: Secondary | ICD-10-CM | POA: Diagnosis not present

## 2023-10-12 DIAGNOSIS — L570 Actinic keratosis: Secondary | ICD-10-CM | POA: Diagnosis not present

## 2023-11-15 ENCOUNTER — Encounter: Payer: Self-pay | Admitting: Radiology

## 2024-02-17 ENCOUNTER — Other Ambulatory Visit: Payer: Self-pay

## 2024-02-17 DIAGNOSIS — Z1231 Encounter for screening mammogram for malignant neoplasm of breast: Secondary | ICD-10-CM

## 2024-03-29 ENCOUNTER — Ambulatory Visit
